# Patient Record
Sex: Male | Born: 1995 | Race: Black or African American | Hispanic: No | Marital: Single | Smoking: Never smoker
Health system: Southern US, Community
[De-identification: ages and names within clinical notes are randomized; demographics above are authoritative.]

## PROBLEM LIST (undated history)

## (undated) DIAGNOSIS — G4730 Sleep apnea, unspecified: Secondary | ICD-10-CM

---
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
## 2014-09-06 IMAGING — CT CT NECK W/ CM
4 of 5 series · 16 of 33 positions shown, 18 images · IV contrast (CONTRAST)
Comparison: None.

CLINICAL DATA: Sore throat.

EXAM:
CT NECK WITH CONTRAST
TECHNIQUE: Multidetector CT imaging of the neck was performed using the
standard protocol following the bolus administration of intravenous
contrast.
CONTRAST:  75mL OMNIPAQUE IOHEXOL 300 MG/ML  SOLN

[Series 2: soft tissue · axial · 0.50mm/px · z∈[-168,-12]mm · 4 of 131 slices shown]
[im 27/131  soft-tissue]
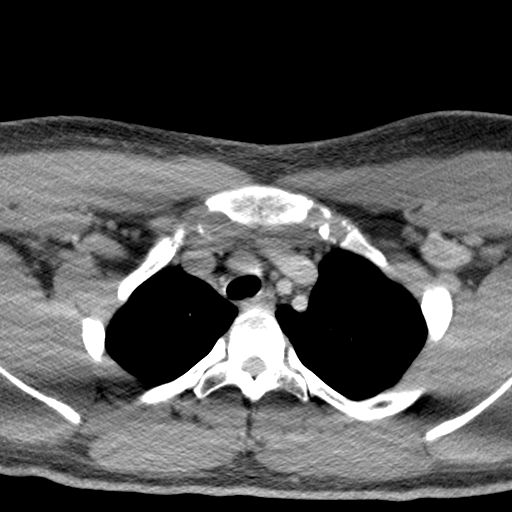
[im 53/131  soft-tissue]
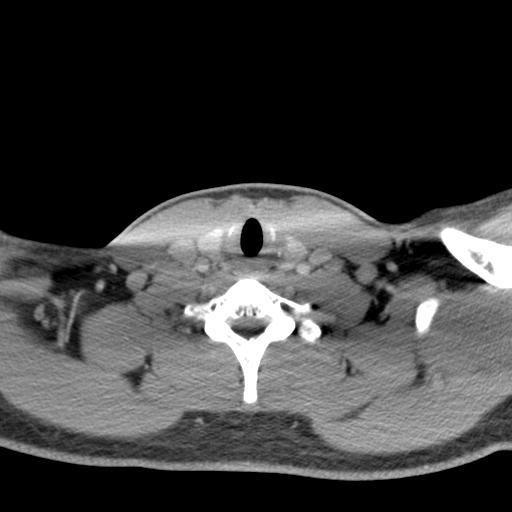
[im 79/131  soft-tissue]
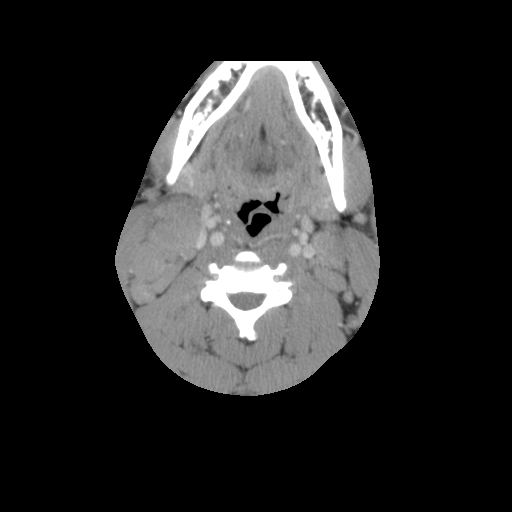
[im 105/131  soft-tissue]
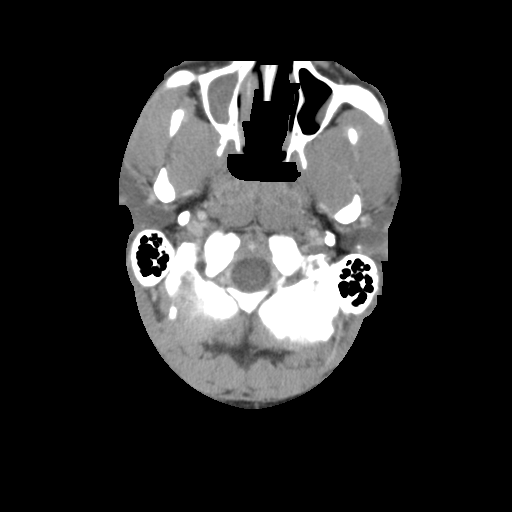

[sagittal · sagittal · 0.51mm/px · 5 of 80 slices shown, 6 images]
[im 27/80  bone]
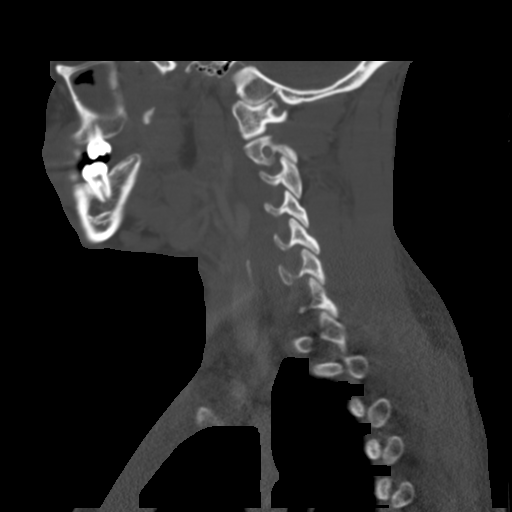
[im 33/80  bone]
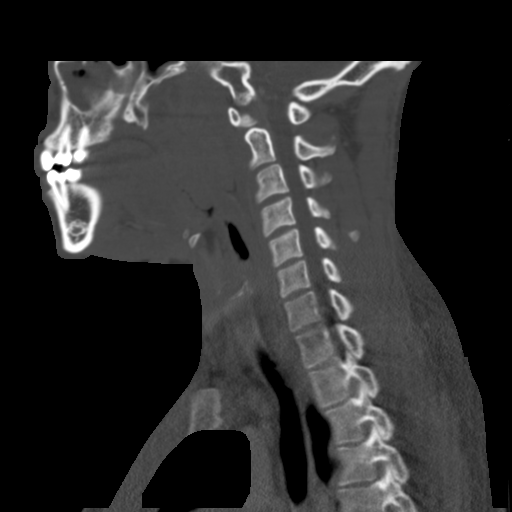
[im 40/80  soft-tissue]
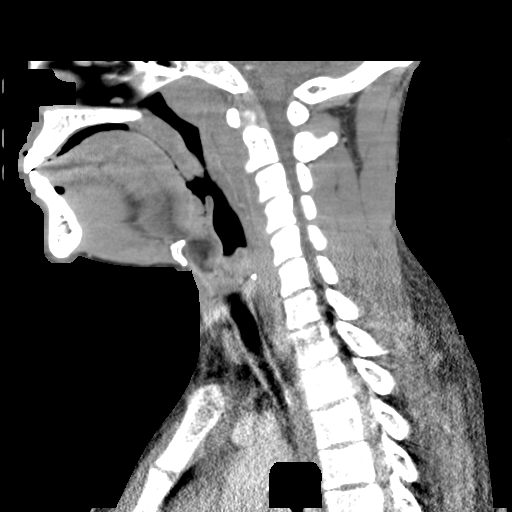
[im 40/80  bone]
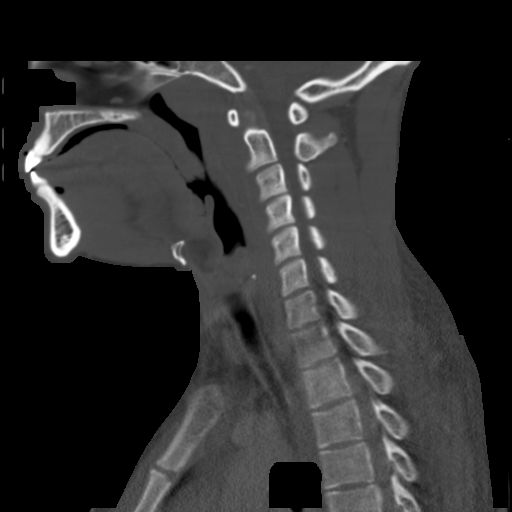
[im 47/80  bone]
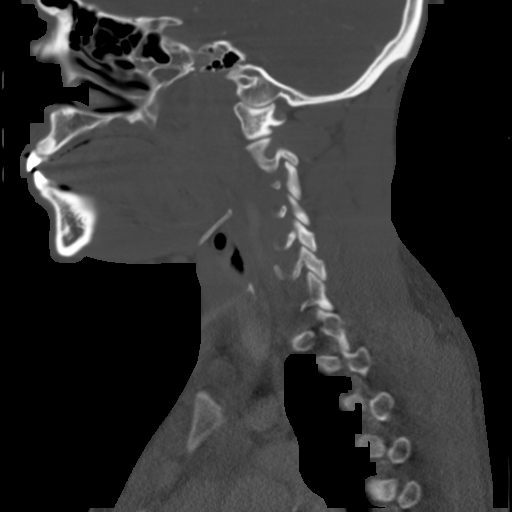
[im 53/80  bone]
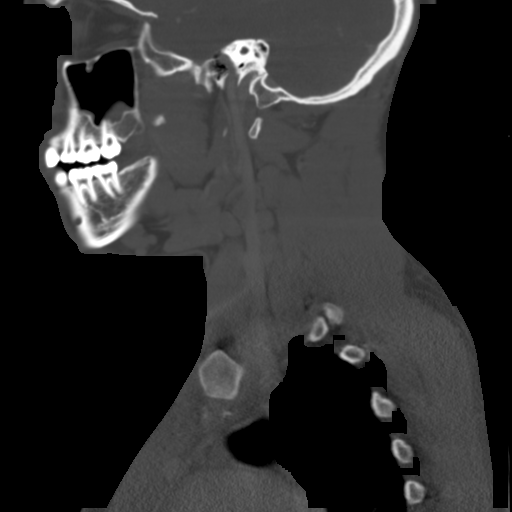

[coronal · coronal · 0.51mm/px · 3 of 96 slices shown]
[im 21/96  bone]
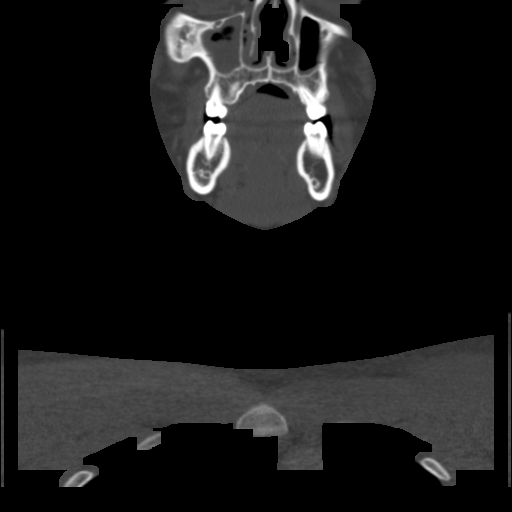
[im 39/96  bone]
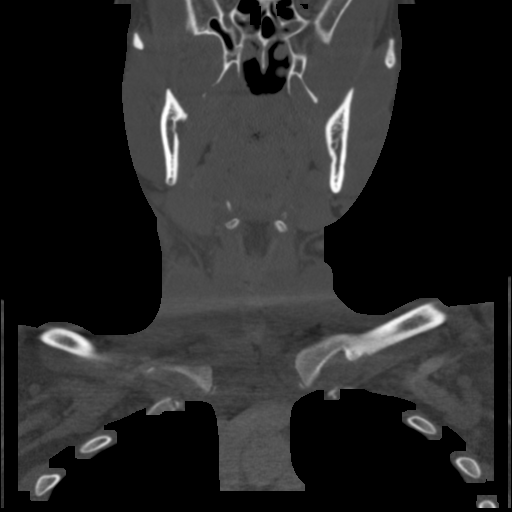
[im 57/96  bone]
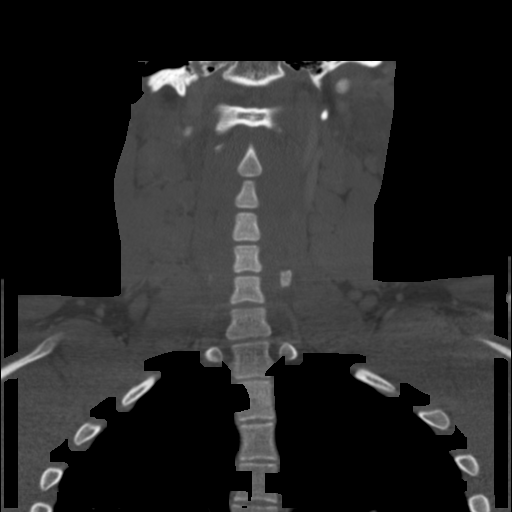

[orthgonal · axial · 0.50mm/px · z∈[-196,-60]mm · 4 of 120 slices shown, 5 images]
[im 24/120  soft-tissue]
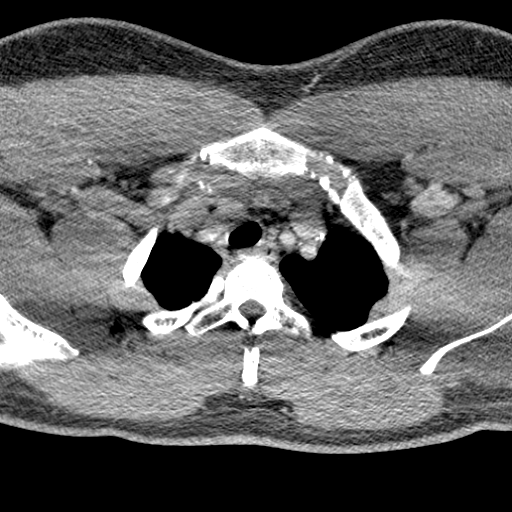
[im 24/120  bone]
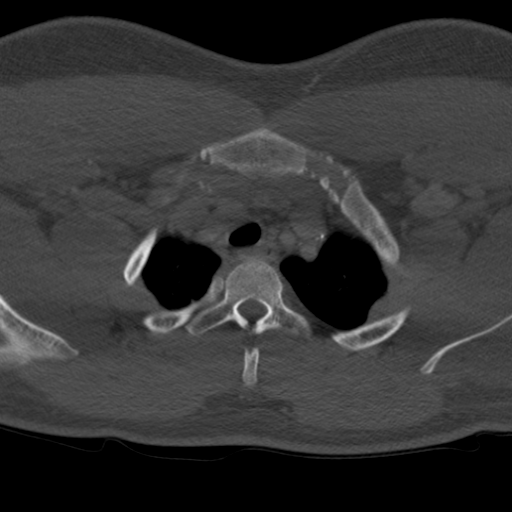
[im 48/120  bone]
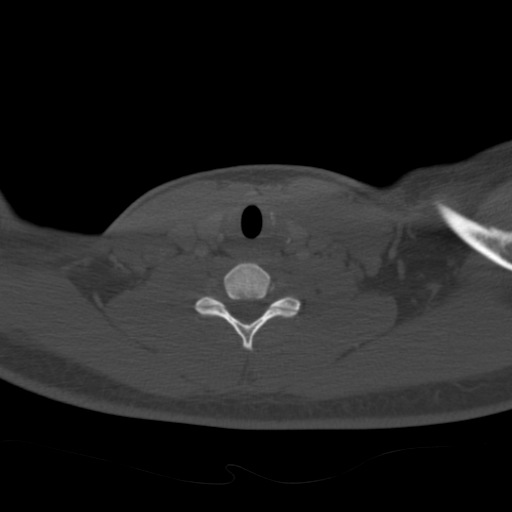
[im 72/120  bone]
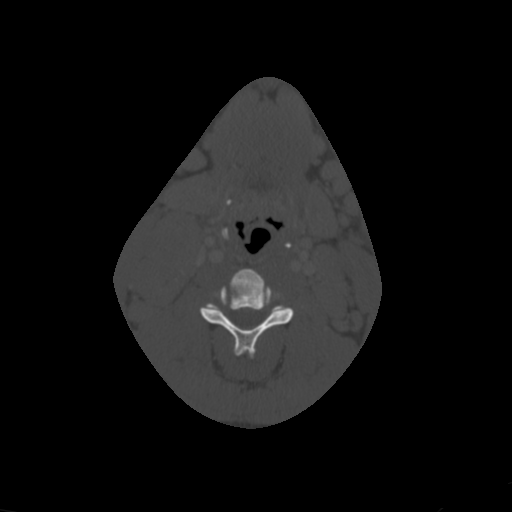
[im 96/120  bone]
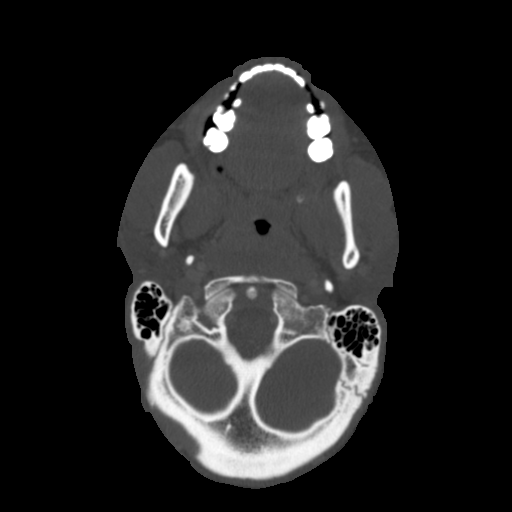

[16 of 33 positions shown; findings below may reference images not displayed]

FINDINGS: Mucosal thickening noted in the ethmoidal and maxillary sinuses.
Orbits are unremarkable. Skull base is normal. Nasopharynx is clear.
Adenoidal prominence is present. Tonsillar prominence is present.
Larynx is unremarkable. Epiglottis is unremarkable. Retropharyngeal/
prevertebral soft tissues are normal. Parotid and submandibular
glands are normal. Extensive severe bilateral cervical
lymphadenopathy is present, particularly in the right jugular chain.
The lymph nodes measure up to 2.8 cm.,image number 49/series 2.
Lucencies are noted in multiple anterior cervical/jugular chain
lymph nodes, image number 56 /series 2, image 61/series 2. This is
most likely secondary to necrosis secondary to infection. Malignancy
cannot be excluded. Close clinical and CT follow-up exam is
suggested to exclude progressive adenopathy, abscess formation, or
other mass lesions. Thyroid is unremarkable. Cervical airway patent.
Cervical vascular structures are normal. Lung apices are clear. No
acute bony abnormality.
IMPRESSION: 1. Extensive cervical lymphadenopathy particularly in the right
jugular chain. Lucencies are noted in multiple very large right
anterior cervical lymph nodes suggesting necrosis,most likely
secondary to infection. Close follow-up clinical and CT exam
suggested to demonstrate resolution and to exclude progressive
adenopathy, abscess, or tumor.

2.  Adenoidal and tonsillar hypertrophy.

3.  Ethmoidal and maxillary sinus disease.

## 2017-12-28 ENCOUNTER — Encounter (HOSPITAL_COMMUNITY): Payer: Self-pay

## 2017-12-28 ENCOUNTER — Other Ambulatory Visit: Payer: Self-pay

## 2017-12-28 ENCOUNTER — Emergency Department (HOSPITAL_COMMUNITY)
Admission: EM | Admit: 2017-12-28 | Discharge: 2017-12-29 | Disposition: A | Discharge status: Home / Self Care | Payer: PRIVATE HEALTH INSURANCE | Attending: Emergency Medicine | Admitting: Emergency Medicine

## 2017-12-28 ENCOUNTER — Emergency Department (HOSPITAL_COMMUNITY): Payer: PRIVATE HEALTH INSURANCE

## 2017-12-28 DIAGNOSIS — W109XXA Fall (on) (from) unspecified stairs and steps, initial encounter: Secondary | ICD-10-CM | POA: Diagnosis not present

## 2017-12-28 DIAGNOSIS — S99922A Unspecified injury of left foot, initial encounter: Secondary | ICD-10-CM | POA: Diagnosis present

## 2017-12-28 DIAGNOSIS — S92415A Nondisplaced fracture of proximal phalanx of left great toe, initial encounter for closed fracture: Secondary | ICD-10-CM | POA: Diagnosis not present

## 2017-12-28 DIAGNOSIS — Y999 Unspecified external cause status: Secondary | ICD-10-CM | POA: Diagnosis not present

## 2017-12-28 DIAGNOSIS — Z79899 Other long term (current) drug therapy: Secondary | ICD-10-CM | POA: Insufficient documentation

## 2017-12-28 DIAGNOSIS — Y9301 Activity, walking, marching and hiking: Secondary | ICD-10-CM | POA: Insufficient documentation

## 2017-12-28 DIAGNOSIS — Y929 Unspecified place or not applicable: Secondary | ICD-10-CM | POA: Diagnosis not present

## 2017-12-28 MED ORDER — IBUPROFEN 800 MG PO TABS
800.0000 mg | ORAL_TABLET | Freq: Once | ORAL | Status: AC
  Administered 2017-12-28: 800 mg via ORAL
  Filled 2017-12-28: qty 1

## 2017-12-28 MED ORDER — HYDROCODONE-ACETAMINOPHEN 5-325 MG PO TABS: 1.0000 | ORAL_TABLET | Freq: Four times a day (QID) | ORAL | 0 refills | Status: AC | PRN

## 2017-12-28 MED ORDER — IBUPROFEN 800 MG PO TABS: 800.0000 mg | ORAL_TABLET | Freq: Three times a day (TID) | ORAL | 0 refills | Status: AC

## 2017-12-28 NOTE — ED Provider Notes (Signed)
[facility]   CSN: [id number] Arrival date & time: 12/28/17  2123     History   Chief Complaint Chief Complaint  Patient presents with  . Toe Pain    HPI Jose Crosby is a 22 y.o. male.  The history is provided by the patient and medical records. No language interpreter was used.  Toe Pain    Jose Crosby is a 22 y.o. male  who presents to the Emergency Department complaining of persistent left great toe pain which began today.  Patient states that he tripped going down the stairs and jammed his toe.  No head injury or complaints of any other injuries.  No medications taken prior to arrival for symptoms.  He is able to walk on the foot, although with a limp.  History reviewed. No pertinent past medical history.  There are no active problems to display for this patient.   History reviewed. No pertinent surgical history.      Home Medications    Prior to Admission medications   Medication Sig Start Date End Date Taking? Authorizing Provider  cetirizine (ZYRTEC) 10 MG tablet Take 10 mg by mouth daily.    [provider]  enalapril (VASOTEC) 10 MG tablet Take 10 mg by mouth daily.    [provider]  HYDROcodone-acetaminophen (HYCET) 7.5-325 mg/15 ml solution Take 10 mLs by mouth 4 (four) times daily as needed for moderate pain. 04/29/13   Fayrene Helperran, Bowie, PA-C  ibuprofen (ADVIL,MOTRIN) 800 MG tablet Take 1 tablet (800 mg total) by mouth 3 (three) times daily. 12/28/17   Ward, Chase PicketJaime Pilcher, PA-C  predniSONE (DELTASONE) 20 MG tablet 3 tabs po day one, then 2 tabs daily x 4 days 04/29/13   Fayrene Helperran, Bowie, PA-C    Family History History reviewed. No pertinent family history.  Social History Social History   Tobacco Use  . Smoking status: Never Smoker  . Smokeless tobacco: Never Used  Substance Use Topics  . Alcohol use: Yes    Comment: occ. weekends  . Drug use: Never     Allergies   Patient has no  known allergies.   Review of Systems Review of Systems  Musculoskeletal: Positive for arthralgias and myalgias.  Skin: Negative for color change and wound.  Neurological: Negative for weakness and numbness.     Physical Exam Updated Vital Signs BP 129/87   Pulse 86   Temp 97.7 F (36.5 C) (Oral)   Resp 18   Ht 5\' 5"  (1.651 m)   Wt 99.8 kg   SpO2 99%   BMI 36.61 kg/m   Physical Exam  Constitutional: He appears well-developed and well-nourished. No distress.  HENT:  Head: Normocephalic and atraumatic.  Neck: Neck supple.  Cardiovascular: Normal rate, regular rhythm and normal heart sounds.  No murmur heard. Pulmonary/Chest: Effort normal and breath sounds normal. No respiratory distress. He has no wheezes. He has no rales.  Musculoskeletal: Normal range of motion.  Left foot with full ROM. Tenderness to palpation of the great toe. 2+ DP. Sensation intact. No open wounds.  Neurological: He is alert.  Skin: Skin is warm and dry.  Nursing note and vitals reviewed.    ED Treatments / Results  Labs (all labs ordered are listed, but only abnormal results are displayed) Labs Reviewed - No data to display  EKG None  Radiology Dg Foot Complete Left  Result Date: 12/28/2017 CLINICAL DATA:  Left toe pain EXAM: LEFT FOOT -  COMPLETE 3+ VIEW COMPARISON:  None. FINDINGS: Acute closed fracture involving the base of the left first proximal phalanx without definite intra-articular extension. No joint dislocation. The remainder of the study is unremarkable. IMPRESSION: Acute nondisplaced fracture at the base of the left first distal phalanx. Electronically Signed   By: Tollie Eth M.D.   On: 12/28/2017 22:30    Procedures Procedures (including critical care time)  Medications Ordered in ED Medications - No data to display   Initial Impression / Assessment and Plan / ED Course  I have reviewed the triage vital signs and the nursing notes.  Pertinent labs & imaging results  that were available during my care of the patient were reviewed by me and considered in my medical decision making (see chart for details).    Jose Crosby is a 22 y.o. male who presents to ED for left great toe pain after tripping down the stairs.  Neurovascularly intact.  No open wounds.  X-ray obtained showing an acute nondisplaced fracture at the base of the left first distal phalanx.  Placed in postop shoe and will follow-up with PCP.  Reasons to return to ER discussed and all questions answered.    Final Clinical Impressions(s) / ED Diagnoses   Final diagnoses:  Closed nondisplaced fracture of proximal phalanx of left great toe, initial encounter    ED Discharge Orders         Ordered    ibuprofen (ADVIL,MOTRIN) 800 MG tablet  3 times daily     12/28/17 2246           Ward, Chase Picket, PA-C 12/28/17 2300    Sabas Sous, MD 12/29/17 0003

## 2017-12-28 NOTE — ED Triage Notes (Signed)
Pt arrives via POV for eval of L toe pain. Pt reports he tripped down stairs and jammed L toe. No obvious swelling/bruising to toe. Pt limping, but able to bear weight.

## 2017-12-28 NOTE — Discharge Instructions (Signed)
It was my pleasure taking care of you today!   Ibuprofen as needed for pain.   Ice and elevate for additional pain relief.   Follow up with your doctor.   Return to ER for new or worsening symptoms, any additional concerns.

## 2017-12-29 NOTE — ED Notes (Signed)
Patient verbalizes understanding of discharge instructions. Opportunity for questioning and answers were provided. Armband removed by staff, pt discharged from ED ambulatory by self\  

## 2019-05-07 IMAGING — CR DG FOOT COMPLETE 3+V*L*
3 series · 3 of 3 positions shown · non-contrast
Comparison: None.

CLINICAL DATA: Left toe pain

EXAM:
LEFT FOOT - COMPLETE 3+ VIEW

[foot ap]
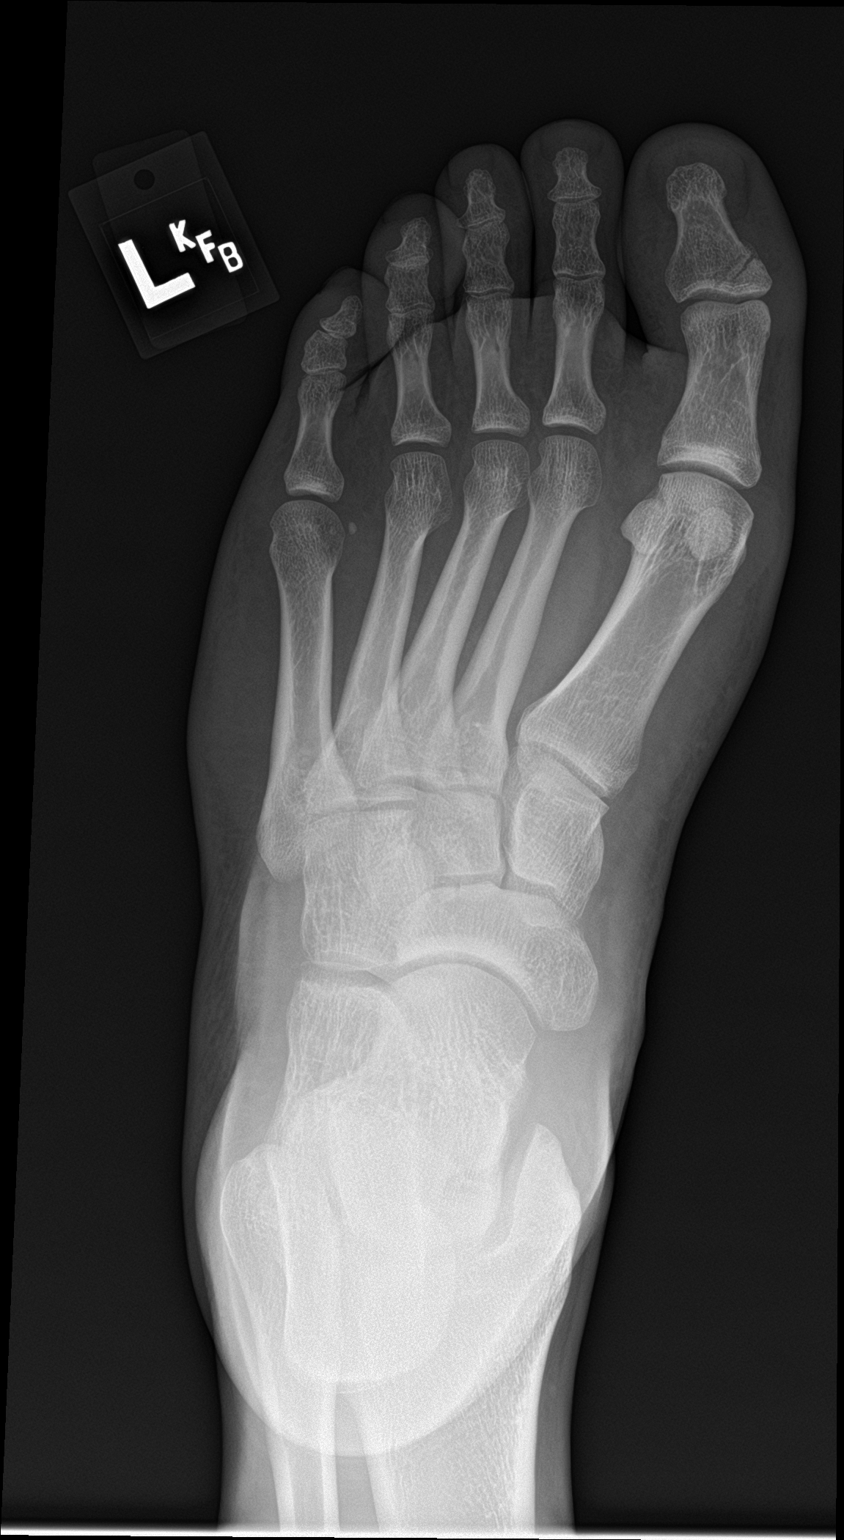

[foot obl]
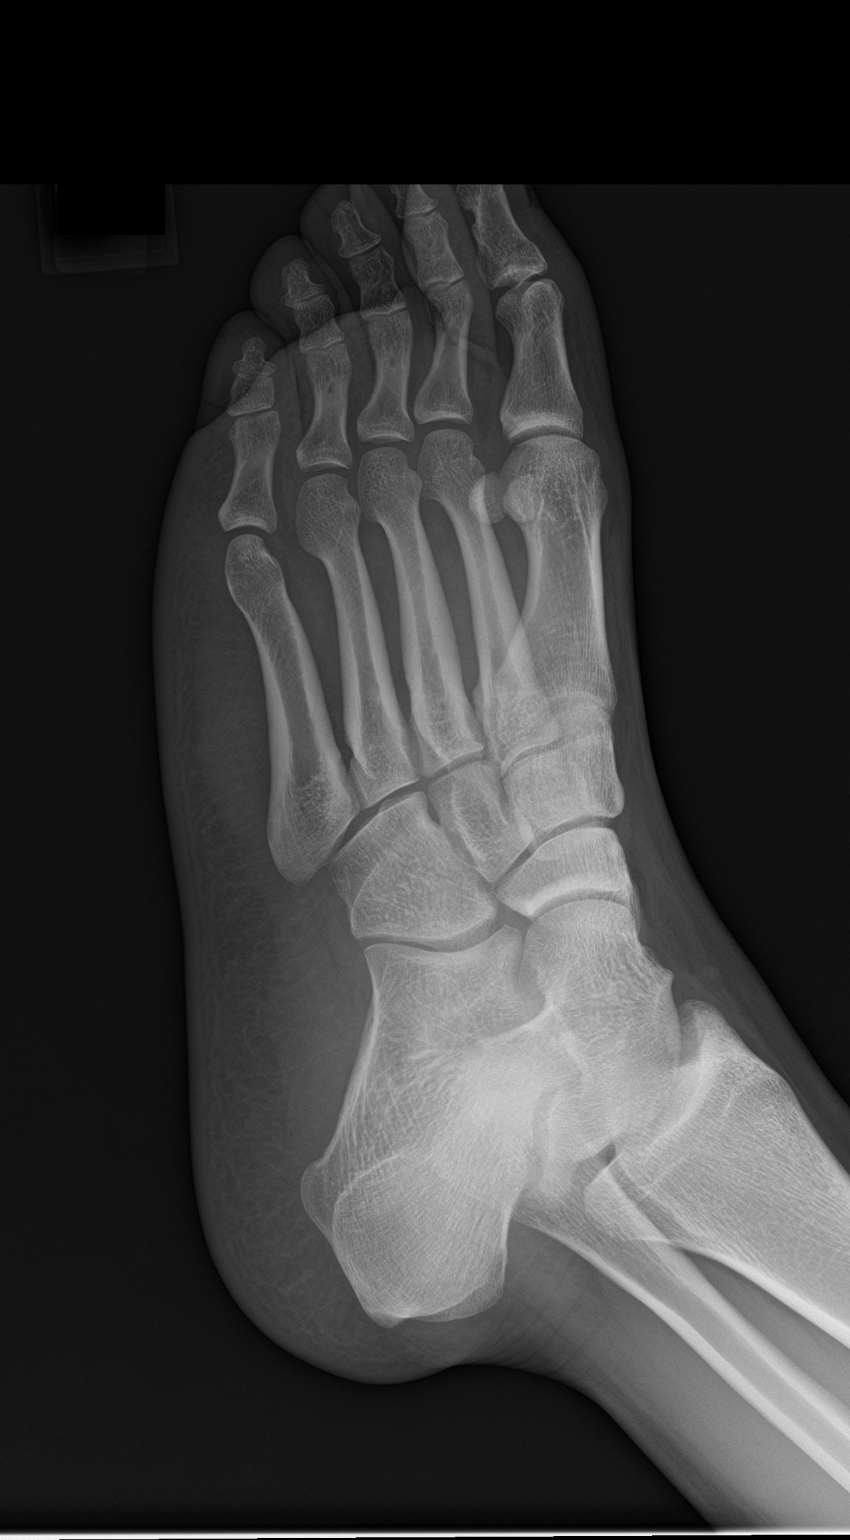

[foot lat]
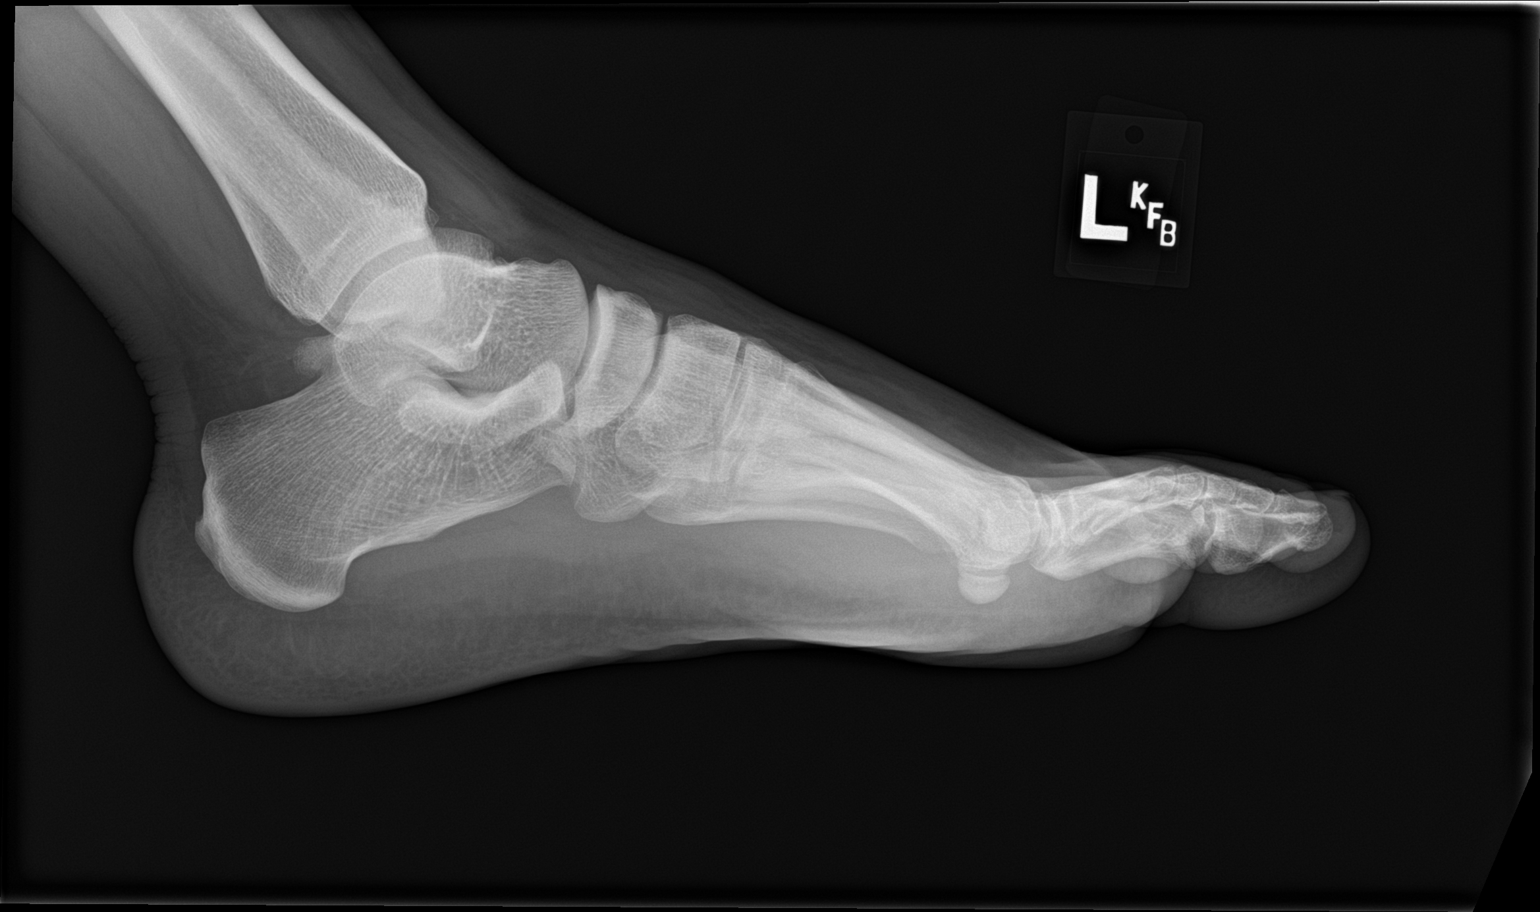

[3 of 3 positions shown; findings below may reference images not displayed]

FINDINGS: Acute closed fracture involving the base of the left first proximal
phalanx without definite intra-articular extension. No joint
dislocation. The remainder of the study is unremarkable.
IMPRESSION: Acute nondisplaced fracture at the base of the left first distal
phalanx.

## 2020-07-29 ENCOUNTER — Encounter

## 2020-08-15 ENCOUNTER — Inpatient Hospital Stay
Admit: 2020-08-15 | Discharge: 2020-08-15 | Disposition: A | Discharge status: Home / Self Care | Payer: PRIVATE HEALTH INSURANCE | Attending: Emergency Medicine

## 2020-08-15 DIAGNOSIS — U071 COVID-19: Secondary | ICD-10-CM

## 2020-08-15 MED ORDER — ACETAMINOPHEN 325 MG TABLET
325 mg | ORAL | Status: AC
Start: 2020-08-15 — End: 2020-08-15
  Administered 2020-08-15: 19:00:00 via ORAL

## 2020-08-15 MED ORDER — IBUPROFEN 800 MG TAB
800 mg | ORAL_TABLET | Freq: Three times a day (TID) | ORAL | 0 refills | Status: AC | PRN
Start: 2020-08-15 — End: 2020-08-25

## 2020-08-15 MED ORDER — BENZONATATE 100 MG CAP
100 mg | ORAL_CAPSULE | Freq: Three times a day (TID) | ORAL | 0 refills | Status: AC | PRN
Start: 2020-08-15 — End: 2020-08-25

## 2020-08-15 MED FILL — ACETAMINOPHEN 325 MG TABLET: 325 mg | ORAL | Qty: 3

## 2020-08-15 NOTE — ED Notes (Signed)
Tested positive for Covid today.    C/o sore throat, cough and feverish x 2 days

## 2020-08-15 NOTE — ED Provider Notes (Signed)
ED Provider Notes by Shireen Quan, PA at 08/15/20 1458                Author: Shireen Quan, PA  Service: Emergency Medicine  Author Type: Physician Assistant       Filed: 08/15/20 1529  Date of Service: 08/15/20 1458  Status: Attested           Editor: Shireen Quan, PA (Physician Assistant)  Cosigner: Max Fickle, MD at 08/15/20 1706          Attestation signed by Max Fickle, MD at 08/15/20 1706          I, Max Fickle, MD,  have discussed this case with the advanced practice provider. We have reviewed the presentation, pertinent historical and physical  exam findings, as well as relevant laboratory/radiology findings. I have been available at all times and agree with the management and disposition documented here.      Max Fickle, MD   August 15, 2020                                 [facility]   Emergency Department Treatment Report          Patient: Dylan Harrison  Age: 25 y.o.  Sex: male          Date of Birth: [birth year]  Admit Date: 08/15/2020  PCP: None     MRN: [id number]   CSN: [id number]   Attending: Max Fickle, MD         Room: [phone]  Time Dictated: 2:58 PM  APP: Shireen Quan, PA           Chief Complaint      Chief Complaint       Patient presents with        ?  Positive For Covid-19        ?  Sore Throat             History of Present Illness        25 y.o. male presents with concern  for  COVID-19 with associated symptoms of sore throat cough intermittent fevers for the past 2 days.  Patient is fully immunized against COVID-19 including a booster.  He denies known COVID exposures.  He has taken at home test x2 that were both positive  today. Denies chest pain, shortness of breath, abdominal pain, nausea, vomiting, any other symptoms.         Review of Systems        Constitutional:  fever, chills, or weight loss   Eyes: No visual symptoms.   ENT: No sore throat, runny nose or ear pain.   Respiratory:  cough,denies  dyspnea or wheezing.   Cardiovascular: No chest pain, pressure, palpitations, tightness or heaviness.   Gastrointestinal: No vomiting, diarrhea or abdominal pain.   Genitourinary: No dysuria, frequency, or urgency.   Musculoskeletal:Body aches   Integumentary: No rashes.   Neurological: No headaches, sensory or motor symptoms.   Denies complaints in all other systems.           Past Medical/Surgical History     No past medical history on file.   No past surgical history on file.              Social History          Social History  Socioeconomic History         ?  Marital status:  SINGLE                   Family History     No family history on file.        Current Medications          None             Allergies     No Known Allergies        Physical Exam          ED Triage Vitals [08/15/20 1453]     ED Encounter Vitals Group           BP  135/82        Pulse (Heart Rate)  100        Resp Rate  18        Temp  (!) 102.3 ??F (39.1 ??C)        O2 Sat (%)  97 %           Constitutional: Patient appears well developed and well nourished.  Appearance and behavior are age and situation appropriate.   HEENT: Conjunctiva clear.  PERRL. Bilateral TM non edematous or erythematous. Mucous membranes moist, non-erythematous. Surface of the pharynx,  palate, and tongue are pink, moist and without lesions.   Neck: supple, non tender, symmetrical, no masses or JVD.    Respiratory: lungs clear to auscultation, nonlabored respirations. No tachypnea or accessory muscle use.   Cardiovascular: heart regular rate and rhythm without murmur rubs or gallops.     Distal pulses 2+ and equal bilaterally.  No peripheral edema.   Gastrointestinal:  Abdomen soft, nontender without complaint of pain to palpation. Normoactive bowel sounds. No peritoneal findings.    Musculoskeletal: No bony tenderness gross abnormalities.   Integumentary: warm and dry without rashes or lesions   Neurologic: alert and oriented,           Impression and  Management Plan     25 y.o. male  presenting with cough, sore throat, body aches and fever. Positive at home Coivd 19 test. Pateitn without trismus without exudate, uvula midline no swelling of the palate.           This patient was evaluated in the emergency department for symptoms described in the history of present illness. Doubt PNA, sepsis, other serious bacterial infection or acute emergent condition. Is otherwise well-appearing with acceptable vitals, a reassuring  physical exam, and is safe to discharge home following NP swab. Will add to follow-up list to call with results after. Will provide strict return precautions and instructions on self-isolation/quarantine and anticipatory guidance. They were evaluated  in the context of global COVID-19 pandemic, which necessitated consideration of the patient may be at  risk for infection with the SARS-COV-2 virus that causes COVID-19.  Institutional protocols and algorithms that pertain to the evaluation of patients  at risk for COVID-19 are in a state of rapid change based on the information released by regulatory bodies including the CDC and federal state organizations.  These policies and algorithms were followed during the patient's care in the emergency department.         Will d/c to self-quarantine if studies otherwise negative.          Diagnostic Studies     Lab:    No results found for this or any previous visit (from the past 12 hour(s)).  Imaging:     No results found.                      ED Course/Medical Decision Making     Patient remained clinically stable throughout the Emergency Department visit.  Vitals were unremarkable and the patient's condition required no further ED intervention.          Medications       acetaminophen (TYLENOL) tablet 975 mg (has no administration in time range)           Patient isolation        Monitor for worsening or development of symptoms of shortness of breath, increased weakness or fever.      This patient was  evaluated in the emergency department for symptoms described in the history of present illness.  he was evaluated in the content  of global COVID-19 pandemic, which necessitated consideration of the patient may be at risk for infection with the SARS-COV-2 virus that causes COVID-19.  Institutional protocols and algorithms that pertain to the evaluation of patients at risk for COVID-19  are in a state of rapid change based on the information released by    regulatory bodies including the CDC and federal state organizations.  These policies and algorithms were followed during the patient's care in the emergency department.          Final Diagnosis                 ICD-10-CM  ICD-9-CM          1.  COVID-19   U07.1  079.89             Disposition             Current Discharge Medication List              START taking these medications          Details        ibuprofen (MOTRIN) 800 mg tablet  Take 1 Tablet by mouth every eight (8) hours as needed for Pain for up to 10 days.   Qty: 30 Tablet, Refills:  0   Start date: 08/15/2020, End date:  08/25/2020               benzonatate (Tessalon Perles) 100 mg capsule  Take 1 Capsule by mouth three (3) times daily as needed for Cough for up to 10 days.   Qty: 30 Capsule, Refills:  0   Start date: 08/15/2020, End date:  08/25/2020                         D/C home with quarantine instructions and return precautions given.      Patient is discharged home in stable condition, with instructions to follow up with their regular doctor. They are advised to return immediately for any worsening or symptoms of concern.      The patient was personally evaluated by myself and with general supervision by [facility] Richarda Osmond, MD who agrees with the above assessment and plan.      Shireen Quan, PA   August 15, 2020      My signature above authenticates this document and my orders, the final     diagnosis (es), discharge prescription (s), and instructions in the Epic     record.   If you have any  questions please contact [phone].  Nursing notes have been reviewed by the physician/ advanced practice     Clinician.      Dragon medical dictation software was used for portions of this report. Unintended voice recognition grammatical errors may occur.

## 2020-10-24 ENCOUNTER — Inpatient Hospital Stay: Payer: PRIVATE HEALTH INSURANCE | Attending: Pulmonary Disease
